# Patient Record
Sex: Male | Born: 1990 | Race: White | Hispanic: No | Marital: Married | Smoking: Never smoker
Health system: Southern US, Community
[De-identification: ages and names within clinical notes are randomized; demographics above are authoritative.]

## PROBLEM LIST (undated history)

## (undated) DIAGNOSIS — N200 Calculus of kidney: Secondary | ICD-10-CM

---
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
## 2010-12-27 IMAGING — US US EXTREM LOW NON VASC*R*
1 series · 14 of 19 positions shown · non-contrast
Comparison: None

CLINICAL DATA: Kicked at the right calf for with focal pain
question torn muscle.

RIGHT LOWER EXTREMITY SOFT TISSUE ULTRASOUND
TECHNIQUE: Ultrasound examination of the soft tissues was
performed in the area of clinical concern.

[Series 1: us extrem low non vasc*right* · 0.11mm/px · 14 of 19 slices shown]
[im 1/19]
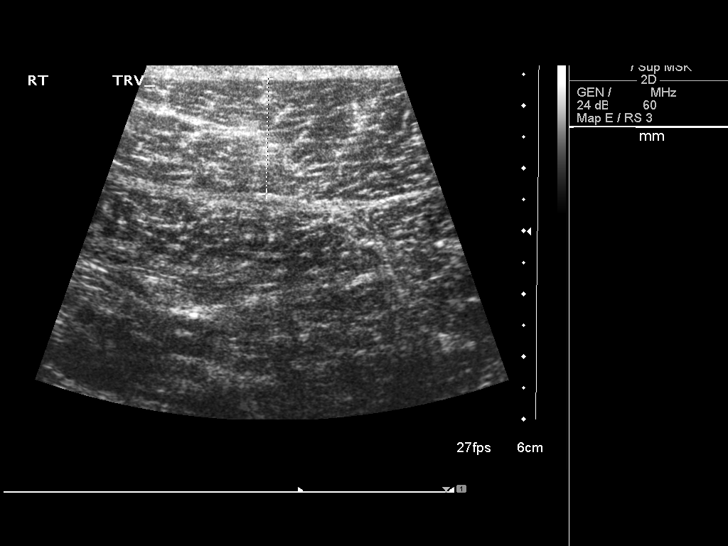
[im 3/19]
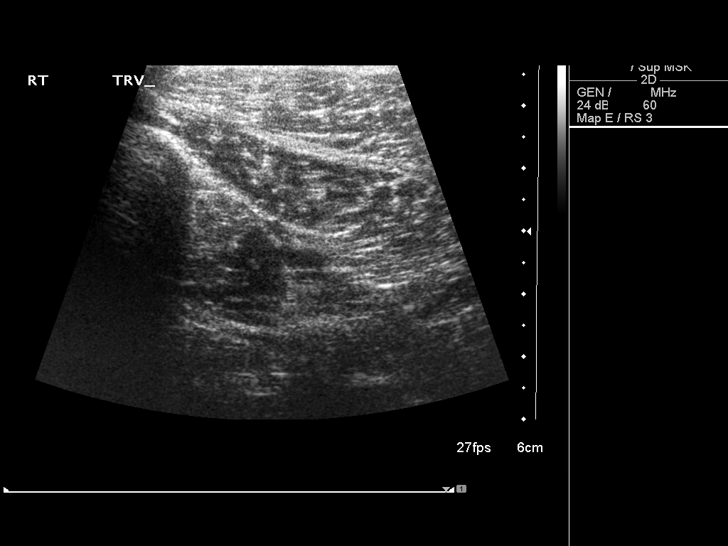
[im 4/19]
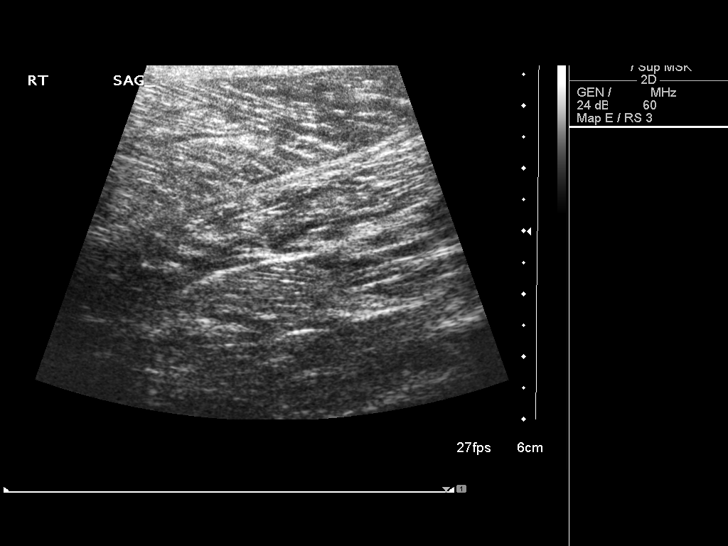
[im 5/19]
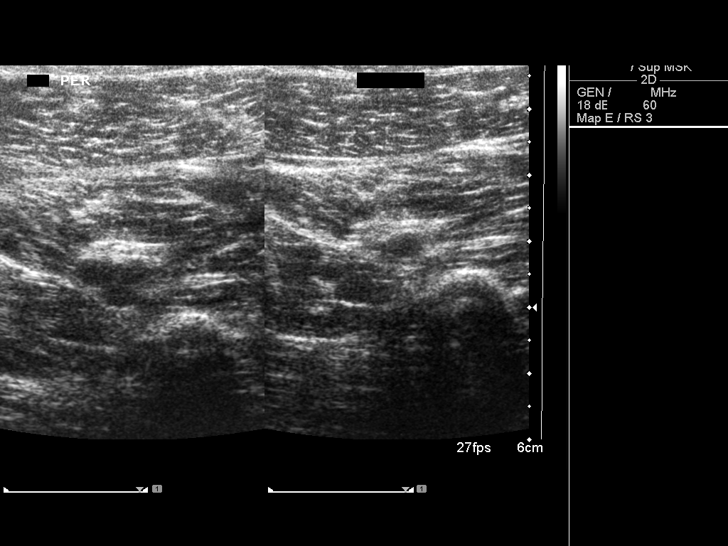
[im 7/19]
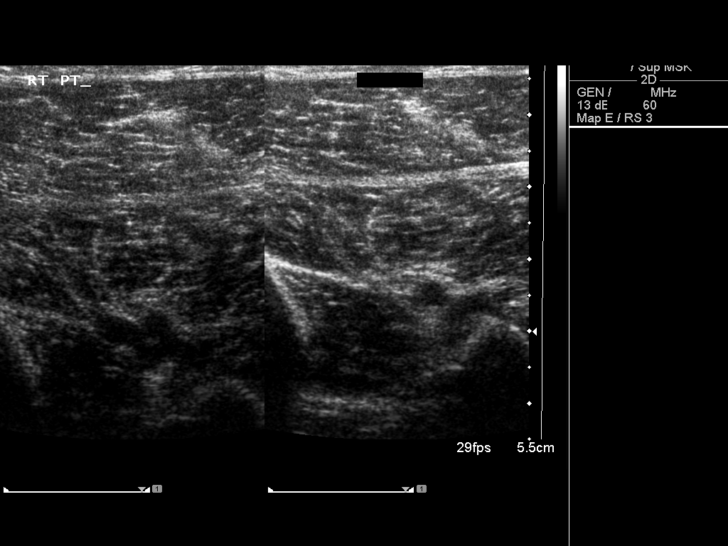
[im 8/19]
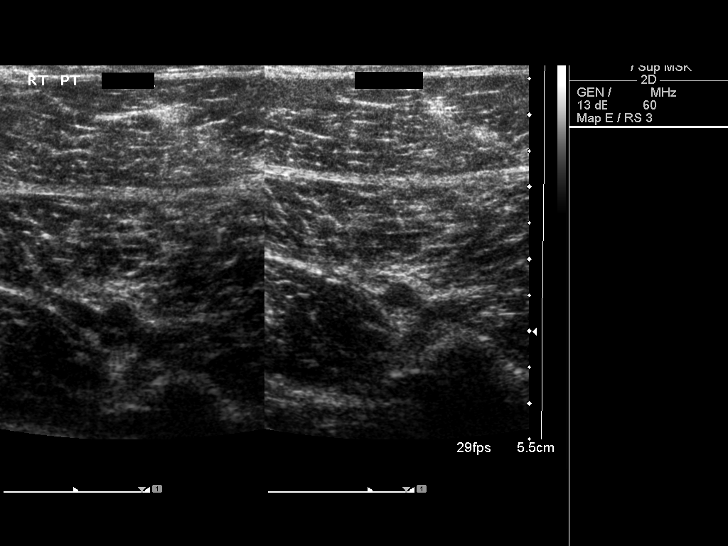
[im 9/19]
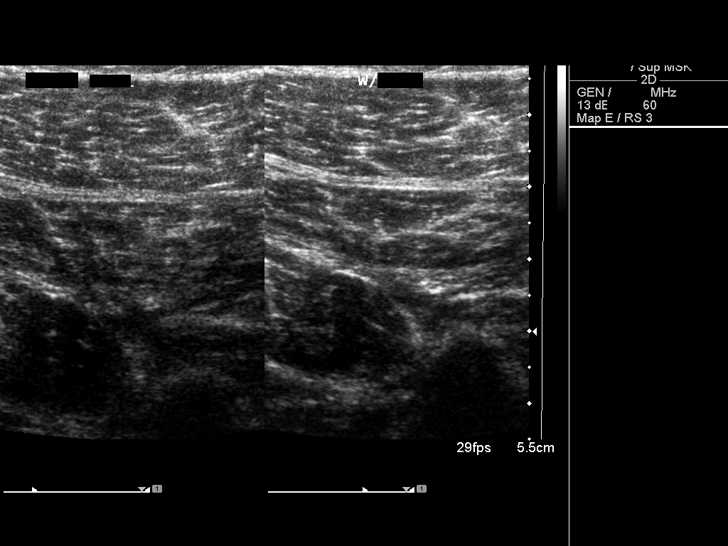
[im 11/19]
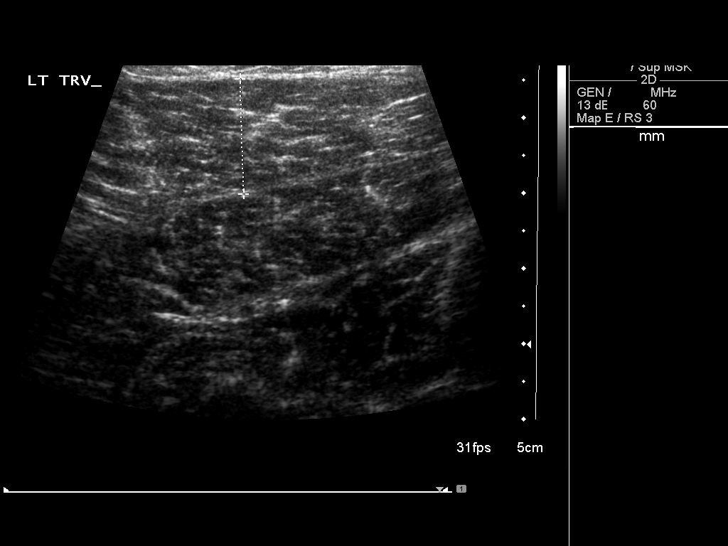
[im 12/19]
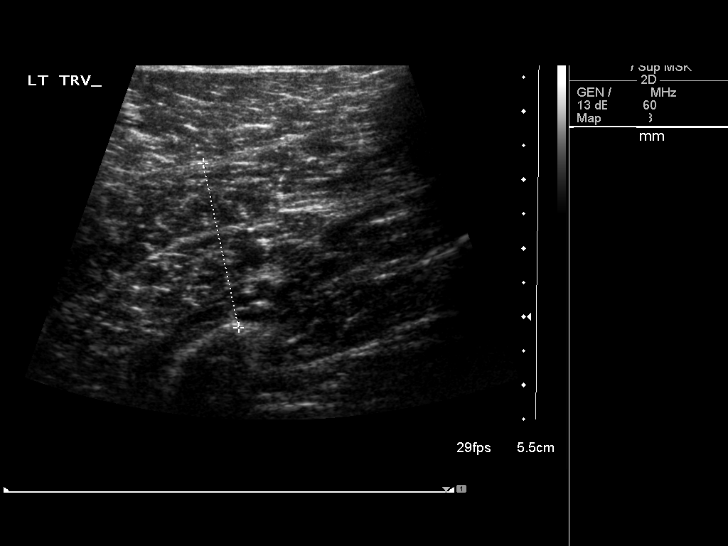
[im 13/19]
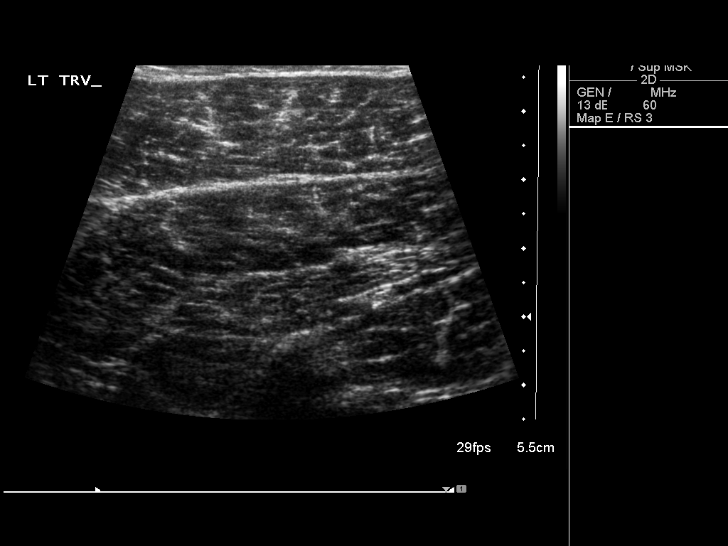
[im 15/19]
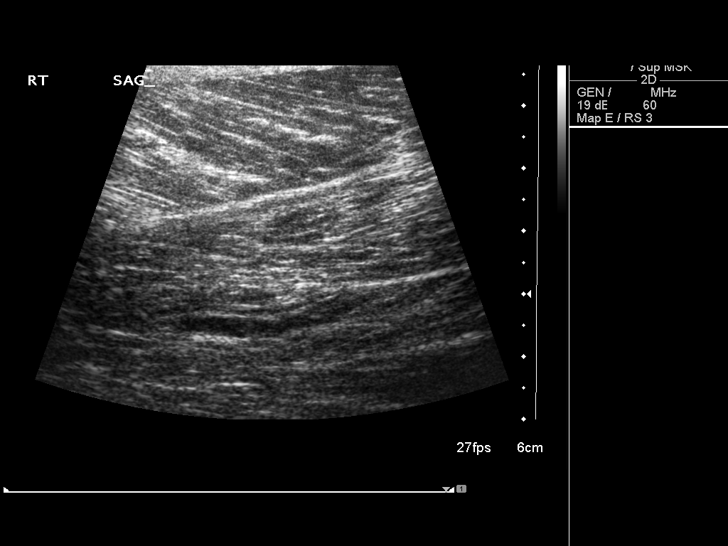
[im 16/19]
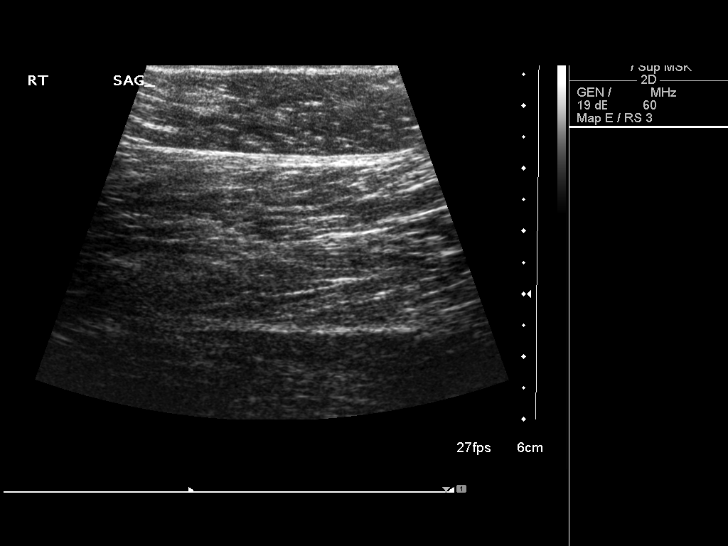
[im 17/19]
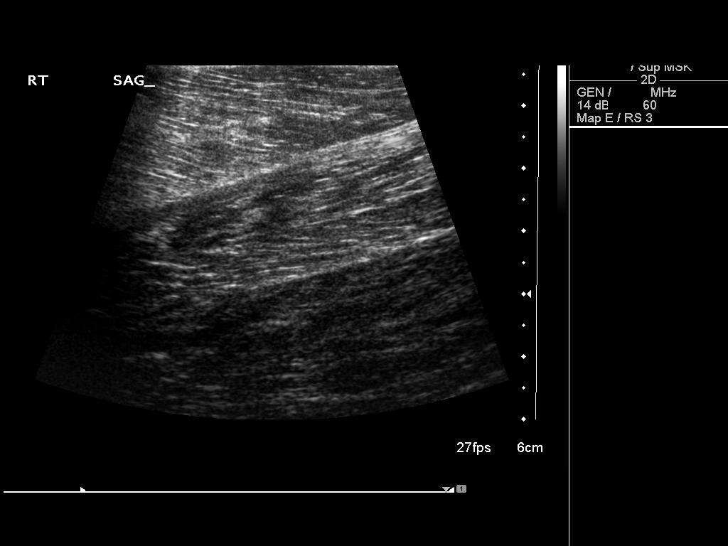
[im 19/19]
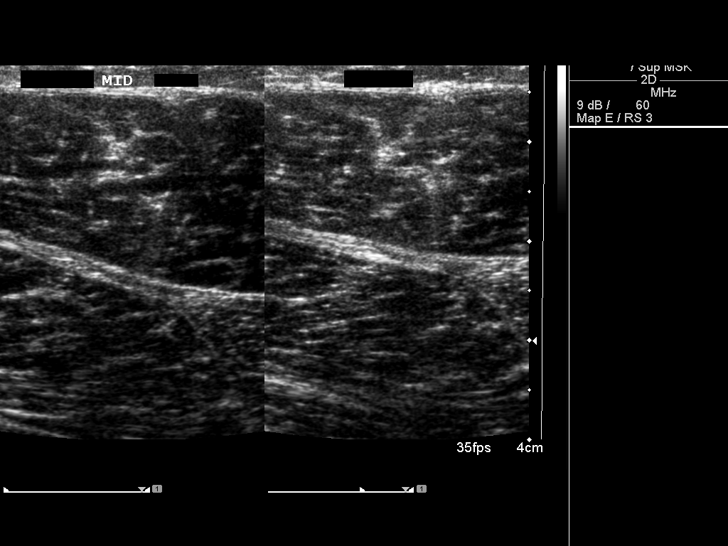

[14 of 19 positions shown; findings below may reference images not displayed]

FINDINGS: No evidence for muscle tear, fascial herniation,
intramuscular hematoma, or related deep venous thrombosis in region
of symptoms at the right calf noted.  Slightly thicker right
gastrocnemius muscle relative to similar level on the asymptomatic
left calf is consistent with post traumatic mild
myositis/contusion.
IMPRESSION: 1.  Slightly thicker involved right gastrocnemius muscle consistent
with post traumatic contusion/myositis.
2.  Otherwise no complicating features.  (If clinical symptoms
persist or progress consider MRI evaluation as clinically
indicated).

## 2017-07-02 ENCOUNTER — Encounter: Payer: Self-pay | Admitting: Family Medicine

## 2017-07-02 ENCOUNTER — Ambulatory Visit (INDEPENDENT_AMBULATORY_CARE_PROVIDER_SITE_OTHER): Payer: BLUE CROSS/BLUE SHIELD | Admitting: Family Medicine

## 2017-07-02 VITALS — BP 124/80 | HR 79 | Temp 97.4°F | Resp 16 | Ht 68.5 in | Wt 153.8 lb

## 2017-07-02 DIAGNOSIS — Z23 Encounter for immunization: Secondary | ICD-10-CM

## 2017-07-02 DIAGNOSIS — Z0000 Encounter for general adult medical examination without abnormal findings: Secondary | ICD-10-CM

## 2017-07-02 DIAGNOSIS — Z113 Encounter for screening for infections with a predominantly sexual mode of transmission: Secondary | ICD-10-CM | POA: Diagnosis not present

## 2017-07-02 NOTE — Progress Notes (Signed)
Subjective:    Patient ID: Guy Nelson, male    DOB: 03-Apr-1991, 26 y.o.   MRN: [id number]  HPI Guy Nelson is a 26 y.o. male Here for physical exam. In school at [employer], masters degree, 1 more year left.   Occasional L shoulder popping. For long time - years. No known injury, but did break collarbone when 27 years old.  Able to exercise and lift weights. Not painful, just notices popping. Does not affect activities.   No known FH of cancer other than grandmother with possible skin cancer.  No new moles, wears sunblock.   Immunizations:   There is no immunization history on file for this patient.  [employer] reviewed. Last tdap in 1996. Has not had HPV vaccination. Up-to-date on meningitis vaccine. MMR 2. Polio 3. Hep B 3. Hep A 2.  Depression screening: Depression screen [facility] 2/9 07/02/2017  Decreased Interest 0  Down, Depressed, Hopeless 0  PHQ - 2 Score 0   Vision screening  Visual Acuity Screening   Right eye Left eye Both eyes  Without correction: _0  With correction:      Dentist: Recent visit.   Exercise:  3-4 days per week. Cardio and weightlifting.   STI testing: Has not been tested previously. 10 lifetime partners. Has had unprotected intercourse with 2 of those partners.   There are no active problems to display for this patient.  History reviewed. No pertinent past medical history. History reviewed. No pertinent surgical history. No Known Allergies Prior to Admission medications   Not on File   Social History   Social History  . Marital status: Married    Spouse name: N/A  . Number of children: N/A  . Years of education: N/A   Occupational History  . Not on file.   Social History Main Topics  . Smoking status: Never Smoker  . Smokeless tobacco: Never Used  . Alcohol use Yes     Comment: occassional beer at dinner  . Drug use: No  . Sexual activity: Not on file   Other Topics  Concern  . Not on file   Social History Narrative  . No narrative on file    Review of Systems 13 point review of systems per patient health survey noted.  No positive listings.     Objective:   Physical Exam  Constitutional: He is oriented to person, place, and time. He appears well-developed and well-nourished.  HENT:  Head: Normocephalic and atraumatic.  Right Ear: External ear normal.  Left Ear: External ear normal.  Mouth/Throat: Oropharynx is clear and moist.  Eyes: Pupils are equal, round, and reactive to light. Conjunctivae and EOM are normal.  Neck: Normal range of motion. Neck supple. No thyromegaly present.  Cardiovascular: Normal rate, regular rhythm, normal heart sounds and intact distal pulses.   Pulmonary/Chest: Effort normal and breath sounds normal. No respiratory distress. He has no wheezes.  Abdominal: Soft. He exhibits no distension. There is no tenderness.  Musculoskeletal: Normal range of motion. He exhibits no edema or tenderness.       Left shoulder: He exhibits crepitus. He exhibits normal range of motion, no tenderness, no bony tenderness, no swelling, no deformity and no pain.  Full rotator cuff strength, negative Neer, negative Hawkins, negative O'Brien, negative crossover.  Lymphadenopathy:    He has no cervical adenopathy.  Neurological: He is alert and oriented to person, place, and time. He has normal reflexes.  Skin:  Skin is warm and dry.  Psychiatric: He has a normal mood and affect. His behavior is normal.  Vitals reviewed.  Vitals:   07/02/17 0812  BP: 124/80  Pulse: 79  Resp: 16  Temp: (!) 97.4 F (36.3 C)  TempSrc: Oral  SpO2: 99%  Weight: 153 lb 12.8 oz (69.8 kg)  Height: 5' 8.5" (1.74 m)      Assessment & Plan:  Guy Nelson is a 26 y.o. male Annual physical exam  - -anticipatory guidance as below in AVS, screening labs above. Health maintenance items as above in HPI discussed/recommended as applicable.   Need for HPV  vaccine - Plan: HPV vaccine quadravalent 3 dose IM  Need for Tdap vaccination - Plan: Tdap vaccine greater than or equal to 7yo IM  Routine screening for STI (sexually transmitted infection) - Plan: HIV antibody, RPR, GC/Chlamydia Probe Amp  - sti testing as above. Anticipatory guidance given.  Left shoulder popping. No weakness, just crepitus. Discussed if worsening symptoms including any associated pain or difficulty with activities would recommend imaging and possible evaluation with orthopedics.  No orders of the defined types were placed in this encounter.  Patient Instructions   1st dose Gardasil given today.  return in 2 months for second dose then in 6 months for third.  Tdap updated today, that is good for 10 years.  If shoulder popping worsens, or any associated pain or difficulty with use, would recommend evaluation with orthopedics and imaging. Let me know and I can refer you.  Keeping you healthy  Get these tests  Blood pressure- Have your blood pressure checked once a year by your healthcare provider.  Normal blood pressure is 120/80.  Weight- Have your body mass index (BMI) calculated to screen for obesity.  BMI is a measure of body fat based on height and weight. You can also calculate your own BMI at [employer]velB[employer].it.  Cholesterol- Have your cholesterol checked regularly starting at age 27, sooner may be necessary if you have diabetes, high blood pressure, if a family member developed heart diseases at an early age or if you smoke.   Chlamydia, HIV, and other sexual transmitted disease- Get screened each year until the age of 41 then within three months of each new sexual partner.  Diabetes- Have your blood sugar checked regularly if you have high blood pressure, high cholesterol, a family history of diabetes or if you are overweight.  Get these vaccines  Flu shot- Every fall.  Tetanus shot- Every 10 years.  Menactra- Single dose; prevents  meningitis.  Take these steps  Don't smoke- If you do smoke, ask your healthcare provider about quitting. For tips on how to quit, go to www.smokefree.gov or call 1-800-QUIT-NOW.  Be physically active- Exercise 5 days a week for at least 30 minutes.  If you are not already physically active start slow and gradually work up to 30 minutes of moderate physical activity.  Examples of moderate activity include walking briskly, mowing the yard, dancing, swimming bicycling, etc.  Eat a healthy diet- Eat a variety of healthy foods such as fruits, vegetables, low fat milk, low fat cheese, yogurt, lean meats, poultry, fish, beans, tofu, etc.  For more information on healthy eating, go to www.thenutritionsource.org  Drink alcohol in moderation- Limit alcohol intake two drinks or less a day.  Never drink and drive.  Dentist- Brush and floss teeth twice daily; visit your dentis twice a year.  Depression-Your emotional health is as important as your physical health.  If you're feeling down, losing interest in things you normally enjoy please talk with your healthcare provider.  Gun Safety- If you keep a gun in your home, keep it unloaded and with the safety lock on.  Bullets should be stored separately.  Helmet use- Always wear a helmet when riding a motorcycle, bicycle, rollerblading or skateboarding.  Safe sex- If you may be exposed to a sexually transmitted infection, use a condom  Seat belts- Seat bels can save your life; always wear one.  Smoke/Carbon Monoxide detectors- These detectors need to be installed on the appropriate level of your home.  Replace batteries at least once a year.  Skin Cancer- When out in the sun, cover up and use sunscreen SPF 15 or higher.  Violence- If anyone is threatening or hurting you, please tell your healthcare provider.  IF you received an x-ray today, you will receive an invoice from [facility]. Please contact [facility] at [phone] with  questions or concerns regarding your invoice.   IF you received labwork today, you will receive an invoice from [facility]. Please contact [facility] at [phone] with questions or concerns regarding your invoice.   Our billing staff will not be able to assist you with questions regarding bills from these companies.  You will be contacted with the lab results as soon as they are available. The fastest way to get your results is to activate your My Chart account. Instructions are located on the last page of this paperwork. If you have not heard from [place] regarding the results in 2 weeks, please contact this office.      Signed,   Merri Ray, MD [facility].  07/02/17 8:56 AM

## 2017-07-02 NOTE — Patient Instructions (Addendum)
1st dose Gardasil given today.  return in 2 months for second dose then in 6 months for third.  Tdap updated today, that is good for 10 years.  If shoulder popping worsens, or any associated pain or difficulty with use, would recommend evaluation with orthopedics and imaging. Let me know and I can refer you.  Keeping you healthy  Get these tests  Blood pressure- Have your blood pressure checked once a year by your healthcare provider.  Normal blood pressure is 120/80.  Weight- Have your body mass index (BMI) calculated to screen for obesity.  BMI is a measure of body fat based on height and weight. You can also calculate your own BMI at https://www.west-esparza.com/.  Cholesterol- Have your cholesterol checked regularly starting at age 55, sooner may be necessary if you have diabetes, high blood pressure, if a family member developed heart diseases at an early age or if you smoke.   Chlamydia, HIV, and other sexual transmitted disease- Get screened each year until the age of 106 then within three months of each new sexual partner.  Diabetes- Have your blood sugar checked regularly if you have high blood pressure, high cholesterol, a family history of diabetes or if you are overweight.  Get these vaccines  Flu shot- Every fall.  Tetanus shot- Every 10 years.  Menactra- Single dose; prevents meningitis.  Take these steps  Don't smoke- If you do smoke, ask your healthcare provider about quitting. For tips on how to quit, go to www.smokefree.gov or call 1-800-QUIT-NOW.  Be physically active- Exercise 5 days a week for at least 30 minutes.  If you are not already physically active start slow and gradually work up to 30 minutes of moderate physical activity.  Examples of moderate activity include walking briskly, mowing the yard, dancing, swimming bicycling, etc.  Eat a healthy diet- Eat a variety of healthy foods such as fruits, vegetables, low fat milk, low fat cheese, yogurt, lean meats,  poultry, fish, beans, tofu, etc.  For more information on healthy eating, go to www.thenutritionsource.org  Drink alcohol in moderation- Limit alcohol intake two drinks or less a day.  Never drink and drive.  Dentist- Brush and floss teeth twice daily; visit your dentis twice a year.  Depression-Your emotional health is as important as your physical health.  If you're feeling down, losing interest in things you normally enjoy please talk with your healthcare provider.  Gun Safety- If you keep a gun in your home, keep it unloaded and with the safety lock on.  Bullets should be stored separately.  Helmet use- Always wear a helmet when riding a motorcycle, bicycle, rollerblading or skateboarding.  Safe sex- If you may be exposed to a sexually transmitted infection, use a condom  Seat belts- Seat bels can save your life; always wear one.  Smoke/Carbon Monoxide detectors- These detectors need to be installed on the appropriate level of your home.  Replace batteries at least once a year.  Skin Cancer- When out in the sun, cover up and use sunscreen SPF 15 or higher.  Violence- If anyone is threatening or hurting you, please tell your healthcare provider.  IF you received an x-ray today, you will receive an invoice from [facility]. Please contact [facility] at [phone] with questions or concerns regarding your invoice.   IF you received labwork today, you will receive an invoice from [facility]. Please contact [facility] at [phone] with questions or concerns regarding your invoice.   Our billing staff will not be  able to assist you with questions regarding bills from these companies.  You will be contacted with the lab results as soon as they are available. The fastest way to get your results is to activate your My Chart account. Instructions are located on the last page of this paperwork. If you have not heard from us regarding the results in 2 weeks, please contact  this office.

## 2017-07-03 LAB — HIV ANTIBODY (ROUTINE TESTING W REFLEX): HIV SCREEN 4TH GENERATION: NONREACTIVE

## 2017-07-03 LAB — RPR: RPR: NONREACTIVE

## 2017-07-06 LAB — GC/CHLAMYDIA PROBE AMP
Chlamydia trachomatis, NAA: NEGATIVE
Neisseria gonorrhoeae by PCR: NEGATIVE

## 2017-07-14 ENCOUNTER — Telehealth: Payer: Self-pay | Admitting: Emergency Medicine

## 2017-07-14 NOTE — Telephone Encounter (Signed)
Unable to reach patient.

## 2017-07-14 NOTE — Progress Notes (Signed)
Left message to return call 

## 2017-07-14 NOTE — Telephone Encounter (Signed)
-----   Message from Shade FloodJeffrey R Greene, MD sent at 07/04/2017 10:37 PM EDT ----- Call patient.  STI testing with HIV and syphilis tests were nonreactive or normal.

## 2017-07-15 ENCOUNTER — Encounter: Payer: Self-pay | Admitting: *Deleted

## 2018-09-28 ENCOUNTER — Emergency Department (HOSPITAL_BASED_OUTPATIENT_CLINIC_OR_DEPARTMENT_OTHER)
Admission: EM | Admit: 2018-09-28 | Discharge: 2018-09-28 | Disposition: A | Discharge status: Home / Self Care | Payer: No Typology Code available for payment source | Attending: Emergency Medicine | Admitting: Emergency Medicine

## 2018-09-28 ENCOUNTER — Encounter (HOSPITAL_BASED_OUTPATIENT_CLINIC_OR_DEPARTMENT_OTHER): Payer: Self-pay

## 2018-09-28 ENCOUNTER — Emergency Department (HOSPITAL_BASED_OUTPATIENT_CLINIC_OR_DEPARTMENT_OTHER): Payer: No Typology Code available for payment source

## 2018-09-28 ENCOUNTER — Ambulatory Visit (HOSPITAL_COMMUNITY)
Admission: EM | Admit: 2018-09-28 | Discharge: 2018-09-28 | Disposition: A | Discharge status: Home / Self Care | Payer: No Typology Code available for payment source | Source: Home / Self Care

## 2018-09-28 ENCOUNTER — Other Ambulatory Visit: Payer: Self-pay

## 2018-09-28 DIAGNOSIS — M79602 Pain in left arm: Secondary | ICD-10-CM | POA: Diagnosis present

## 2018-09-28 DIAGNOSIS — R58 Hemorrhage, not elsewhere classified: Secondary | ICD-10-CM | POA: Insufficient documentation

## 2018-09-28 DIAGNOSIS — R200 Anesthesia of skin: Secondary | ICD-10-CM | POA: Diagnosis not present

## 2018-09-28 HISTORY — DX: Calculus of kidney: N20.0

## 2018-09-28 NOTE — Discharge Instructions (Signed)
Your xray was unremarkable. There was no evidence of a DVT. Apply ice to the area 3 times daily. See attached handout.   HOW TO MAKE AN ICE PACK  To make an ice pack, do one of the following:  Place crushed ice or a bag of frozen vegetables in a sealable plastic bag. Squeeze out the excess air. Place this bag inside another plastic bag. Slide the bag into a pillowcase or place a damp towel between your skin and the bag.  Mix 3 parts water with 1 part rubbing alcohol. Freeze the mixture in a sealable plastic bag. When you remove the mixture from the freezer, it will be slushy. Squeeze out the excess air. Place this bag inside another plastic bag. Slide the bag into a pillowcase or place a damp towel between your skin and the ice pack.   Follow up with pcp. If you develop worsening or new concerning symptoms you can return to the emergency department for re-evaluation.

## 2018-09-28 NOTE — ED Triage Notes (Addendum)
Pt states he gave blood 10/10-since then left UE has cont'd to bruise and numbness pinky finger-bruising noted-no redness/swelling-NAD-steady gait-[facility] then sent here thinking he was to have US-states he was in xray-they spoke with [facility] and [place] was not ordered and advised pt to be seen in ED

## 2018-09-28 NOTE — ED Provider Notes (Signed)
[facility] EMERGENCY DEPARTMENT Provider Note   CSN: [id number] Arrival date & time: 09/28/18  1708     History   Chief Complaint Chief Complaint  Patient presents with  . Arm Problem    HPI Guy Nelson is a 27 y.o. male with no significant past medical history presents emergency department today for left arm ecchymosis.  Patient reports that he gave blood several days ago.  Since that time he has noticed bruising to his left biceps as well as a dull achy pain.  He notes this morning the bruising tract to his distal forearm and he noticed some numbness in his left pinky.  [facility].  He denies any trauma otherwise.  His pain is currently controlled and rates as a 1/10.  He has not tried anything for symptoms.  No history of DVT/PE.  He denies any chest pain, shortness of breath, cough or hemoptysis.  He is not on blood thinners.  HPI  Past Medical History:  Diagnosis Date  . Kidney stone     There are no active problems to display for this patient.   History reviewed. No pertinent surgical history.      Home Medications    Prior to Admission medications   Not on File    Family History Family History  Problem Relation Age of Onset  . Heart disease Maternal Grandfather   . Heart disease Paternal Grandfather     Social History Social History   Tobacco Use  . Smoking status: Never Smoker  . Smokeless tobacco: Never Used  Substance Use Topics  . Alcohol use: Yes    Comment: occ  . Drug use: No     Allergies   Patient has no known allergies.   Review of Systems Review of Systems  Constitutional: Negative for fever.  Respiratory: Negative for cough and shortness of breath.   Cardiovascular: Negative for chest pain.  Musculoskeletal: Positive for myalgias. Negative for arthralgias.  Skin: Positive for color change. Negative for wound.  Neurological: Negative for weakness and  numbness.  Hematological: Bruises/bleeds easily.     Physical Exam Updated Vital Signs BP (!) 154/93 (BP Location: Left Arm)   Pulse 66   Temp 98.6 F (37 C) (Oral)   Resp 20   Ht 5\' 9"  (1.753 m)   Wt 71.7 kg   SpO2 100%   BMI 23.34 kg/m   Physical Exam  Constitutional: He appears well-developed and well-nourished.  HENT:  Head: Normocephalic and atraumatic.  Right Ear: External ear normal.  Left Ear: External ear normal.  Eyes: Conjunctivae are normal. Right eye exhibits no discharge. Left eye exhibits no discharge. No scleral icterus.  Pulmonary/Chest: Effort normal. No respiratory distress.  Musculoskeletal:       Left shoulder: Normal.       Left elbow: He exhibits normal range of motion. No tenderness found.       Left wrist: He exhibits normal range of motion and no tenderness.  Neurological: He is alert. No sensory deficit.  Skin: Skin is warm and dry. Capillary refill takes less than 2 seconds. Ecchymosis noted. No pallor.  Psychiatric: He has a normal mood and affect.  Nursing note and vitals reviewed.      ED Treatments / Results  Labs (all labs ordered are listed, but only abnormal results are displayed) Labs Reviewed - No data to display  EKG None  Radiology No results found.  Procedures Procedures (including critical care time)  Medications Ordered in ED Medications - No data to display   Initial Impression / Assessment and Plan / ED Course  I have reviewed the triage vital signs and the nursing notes.  Pertinent labs & imaging results that were available during my care of the patient were reviewed by me and considered in my medical decision making (see chart for details).     27 y.o. Male presenting with ecchymosis and pain to the left arm after giving blood several days ago.  He was sent from an clinic to have an ultrasound done.  He denies any trauma otherwise.  Patient is neurovascularly intact.  No evidence of bony injury.  Strength  5/5.  Ultrasound shows no evidence of DVT. Patient treated with ice in the department. Did not wish for oral medication. He does not have any chest pain or sob. I advised the patient to follow-up with pcp this week. Specific return precautions discussed. Time was given for all questions to be answered. The patient verbalized understanding and agreement with plan. The patient appears safe for discharge home.  Final Clinical Impressions(s) / ED Diagnoses   Final diagnoses:  Ecchymosis    ED Discharge Orders    None       Jacinto Halim, Cordelia Poche 09/28/18 1952    Virgina Norfolk, DO 09/29/18 0107

## 2018-09-28 NOTE — ED Notes (Signed)
ED Provider at bedside. 

## 2019-10-12 ENCOUNTER — Other Ambulatory Visit: Payer: Self-pay

## 2019-10-12 DIAGNOSIS — Z20822 Contact with and (suspected) exposure to covid-19: Secondary | ICD-10-CM

## 2019-10-13 LAB — NOVEL CORONAVIRUS, NAA: SARS-CoV-2, NAA: NOT DETECTED

## 2020-03-30 IMAGING — US US EXTREM  UP VENOUS*L*
1 series · 14 of 24 positions shown · non-contrast
Comparison: None.

CLINICAL DATA: Left upper extremity bruising for 6 days

EXAM:
LEFT UPPER EXTREMITY VENOUS DUPLEX ULTRASOUND
TECHNIQUE: Doppler venous assessment of the left upper extremity deep venous
system was performed, including characterization of spectral flow,
compressibility, and phasicity.

[Series 1: us extrem up venous*left* · 0.07mm/px · 14 of 28 slices shown]
[im 1/28]
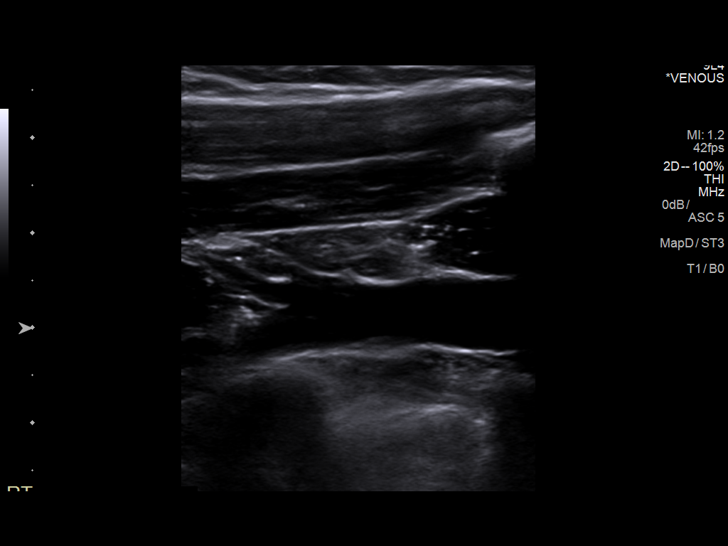
[im 3/28]
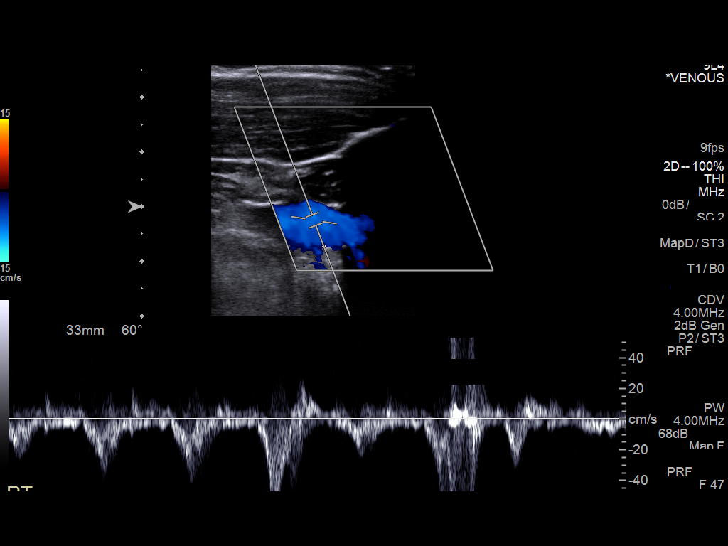
[im 5/28]
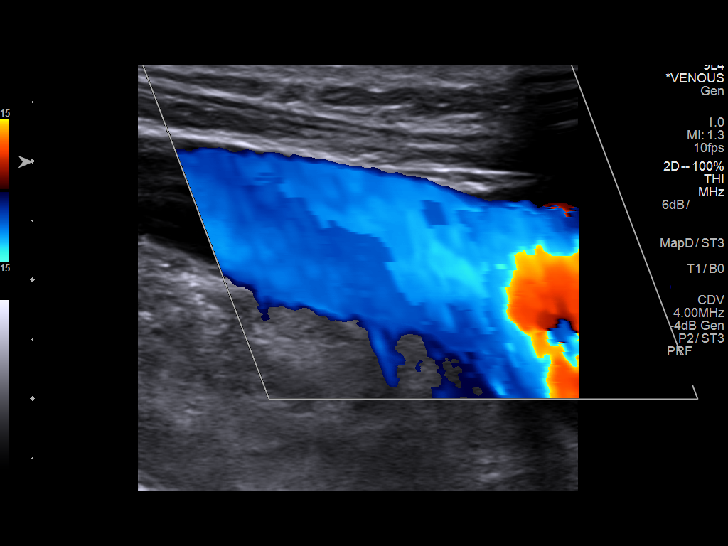
[im 8/28]
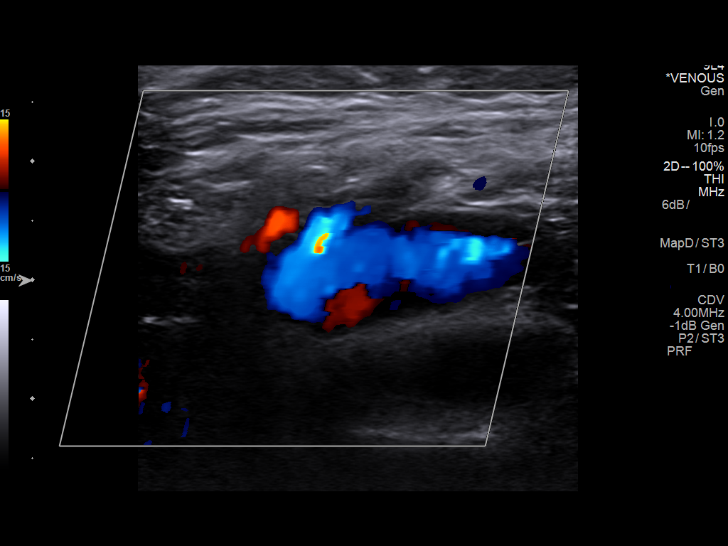
[im 9/28]
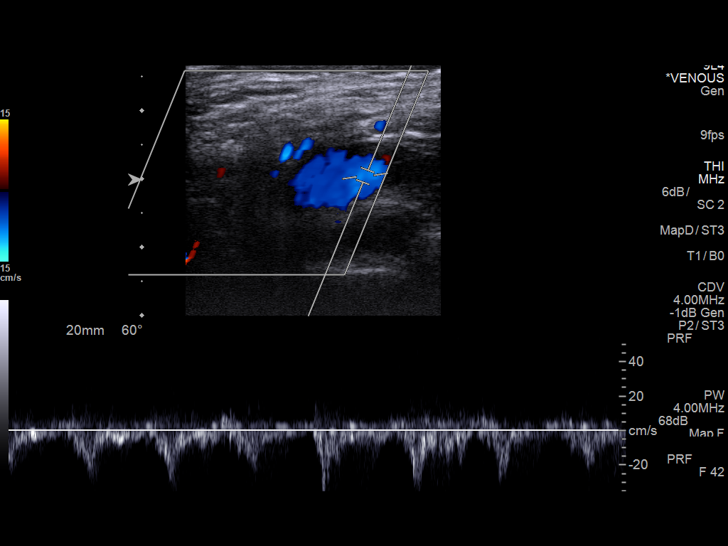
[im 11/28]
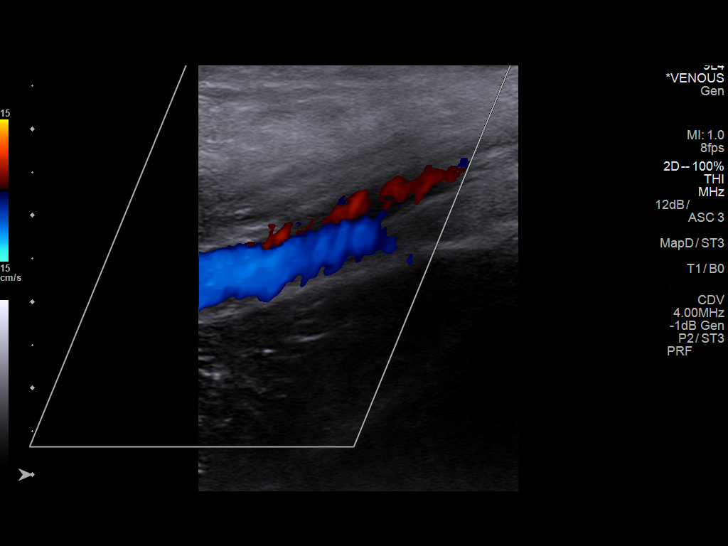
[im 13/28]
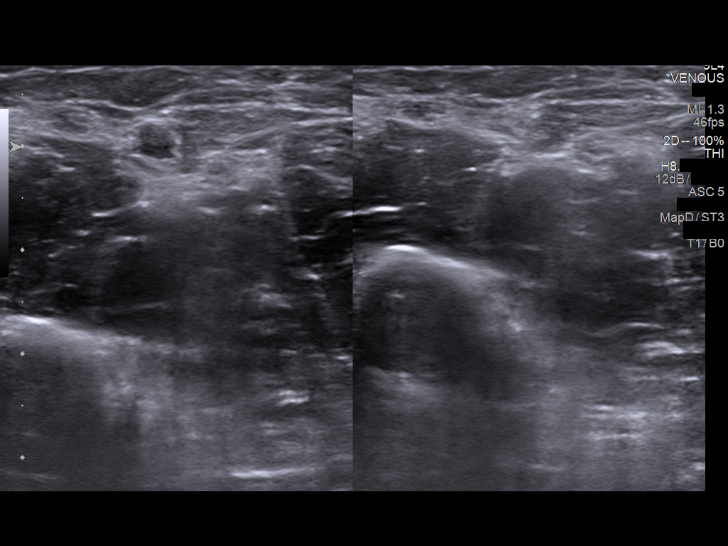
[im 15/28]
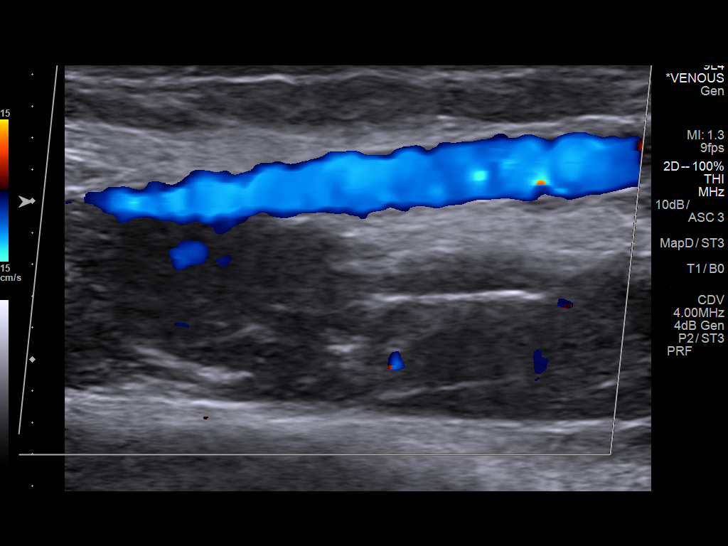
[im 17/28]
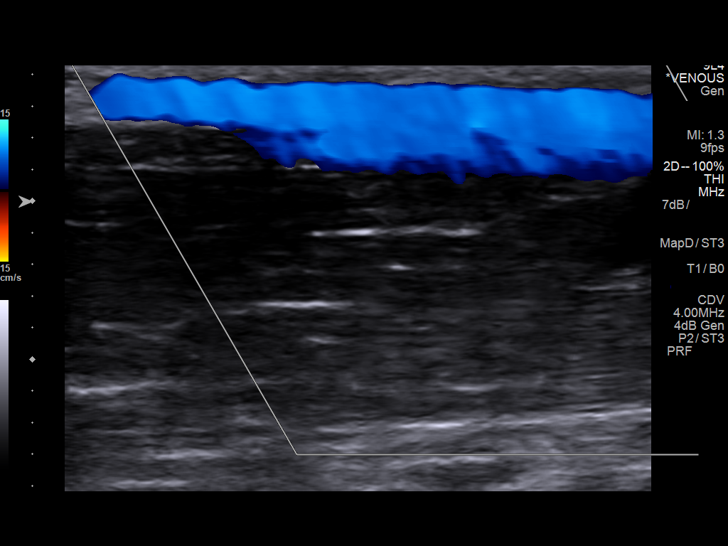
[im 19/28]
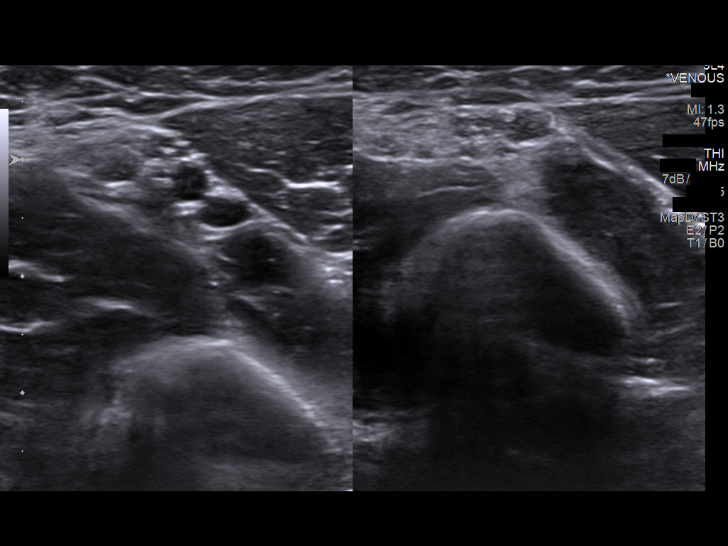
[im 22/28]
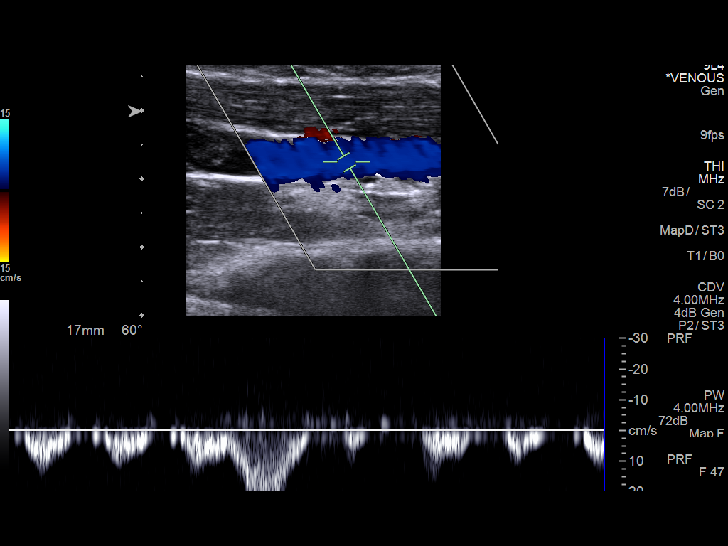
[im 23/28]
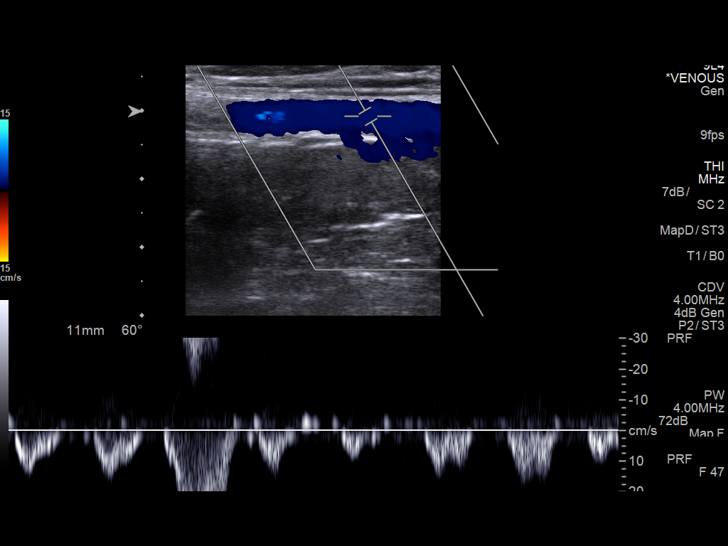
[im 25/28]
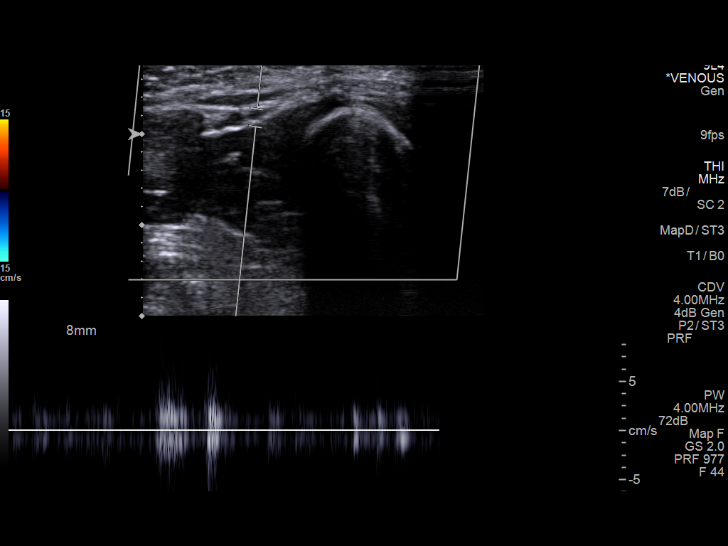
[im 28/28]
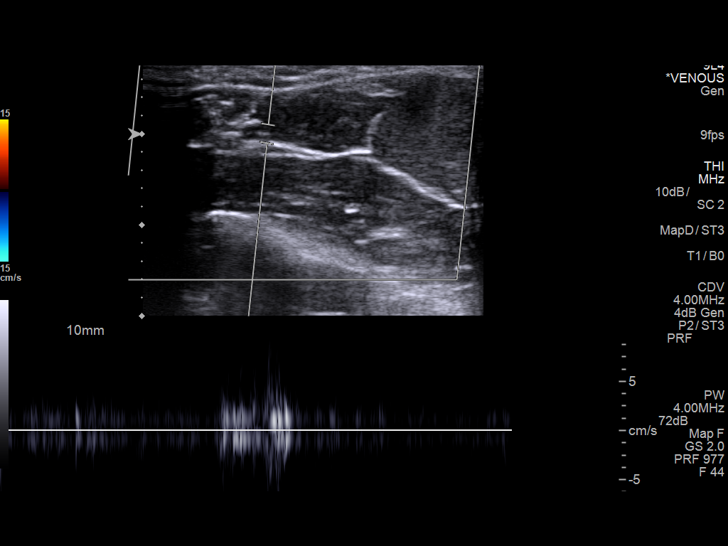

[14 of 24 positions shown; findings below may reference images not displayed]

FINDINGS: The left subclavian vein is widely patent by color Doppler imaging.
Doppler waveform analysis demonstrates a phasic pattern.

There is complete compressibility of the left jugular, axillary,
brachial, radial, and ulnar veins. Doppler waveform analysis
demonstrates a phasic pattern throughout. No obvious superficial
vein thrombosis.
IMPRESSION: No evidence of left upper extremity DVT.
